# Patient Record
Sex: Female | Born: 1945 | Race: Black or African American | Hispanic: No | Marital: Single | Smoking: Never smoker
Health system: Southern US, Community
[De-identification: ages and names within clinical notes are randomized; demographics above are authoritative.]

## PROBLEM LIST (undated history)

## (undated) DIAGNOSIS — R42 Dizziness and giddiness: Secondary | ICD-10-CM

## (undated) DIAGNOSIS — E119 Type 2 diabetes mellitus without complications: Secondary | ICD-10-CM

## (undated) DIAGNOSIS — K5792 Diverticulitis of intestine, part unspecified, without perforation or abscess without bleeding: Secondary | ICD-10-CM

## (undated) DIAGNOSIS — M109 Gout, unspecified: Secondary | ICD-10-CM

## (undated) DIAGNOSIS — M1990 Unspecified osteoarthritis, unspecified site: Secondary | ICD-10-CM

## (undated) DIAGNOSIS — E7800 Pure hypercholesterolemia, unspecified: Secondary | ICD-10-CM

## (undated) DIAGNOSIS — I10 Essential (primary) hypertension: Secondary | ICD-10-CM

## (undated) DIAGNOSIS — H02402 Unspecified ptosis of left eyelid: Secondary | ICD-10-CM

## (undated) DIAGNOSIS — R270 Ataxia, unspecified: Secondary | ICD-10-CM

## (undated) HISTORY — DX: Gout, unspecified: M10.9

## (undated) HISTORY — DX: Unspecified ptosis of left eyelid: H02.402

## (undated) HISTORY — DX: Dizziness and giddiness: R42

## (undated) HISTORY — DX: Diverticulitis of intestine, part unspecified, without perforation or abscess without bleeding: K57.92

## (undated) HISTORY — DX: Type 2 diabetes mellitus without complications: E11.9

## (undated) HISTORY — DX: Ataxia, unspecified: R27.0

## (undated) HISTORY — DX: Essential (primary) hypertension: I10

## (undated) HISTORY — DX: Unspecified osteoarthritis, unspecified site: M19.90

## (undated) HISTORY — DX: Pure hypercholesterolemia, unspecified: E78.00

---

## 1998-07-13 DIAGNOSIS — I671 Cerebral aneurysm, nonruptured: Secondary | ICD-10-CM

## 1998-07-13 HISTORY — DX: Cerebral aneurysm, nonruptured: I67.1

## 1998-11-28 HISTORY — PX: CEREBRAL ANEURYSM REPAIR: SHX164

## 2019-12-25 ENCOUNTER — Encounter: Payer: Self-pay | Admitting: *Deleted

## 2019-12-25 ENCOUNTER — Other Ambulatory Visit: Payer: Self-pay | Admitting: *Deleted

## 2019-12-26 ENCOUNTER — Encounter: Payer: Self-pay | Admitting: Diagnostic Neuroimaging

## 2019-12-26 ENCOUNTER — Ambulatory Visit: Payer: Federal, State, Local not specified - PPO | Admitting: Diagnostic Neuroimaging

## 2019-12-26 VITALS — BP 146/88 | HR 72 | Ht 64.0 in | Wt 188.0 lb

## 2019-12-26 DIAGNOSIS — R42 Dizziness and giddiness: Secondary | ICD-10-CM | POA: Diagnosis not present

## 2019-12-26 DIAGNOSIS — G459 Transient cerebral ischemic attack, unspecified: Secondary | ICD-10-CM

## 2019-12-26 DIAGNOSIS — R269 Unspecified abnormalities of gait and mobility: Secondary | ICD-10-CM

## 2019-12-26 NOTE — Progress Notes (Signed)
[facility]  PATIENT: Deborah Randolph DOB: 1945/10/17  REFERRING CLINICIAN: Glenda Chroman, MD HISTORY FROM: patient and sister  REASON FOR VISIT: new consult    HISTORICAL  CHIEF COMPLAINT:  Chief Complaint  Patient presents with  . Ataxia    rm 6 New Pt, sister- Belenda Cruise, "hx of brain aneurysm with repair 2000; I sometimes fall forward but catch my self"     HISTORY OF PRESENT ILLNESS:   74 year old female here for evaluation of gait and balance difficulty and dizziness.  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  For past 1 to 2 years patient has had gradual onset progressive gait and balance difficulty.  1 year ago she started using a cane for balance.  At times she feels like she is veering off the left side.  Symptoms worse in the last 1 to 2 months.  She denies any focal weakness.  Symptoms fluctuate and are intermittent.  Sometimes she drags her right leg.  She has noticed ongoing fluctuating left ptosis since her aneurysm in 2000.  Also had an attack of "dizziness" on 12/10/2019 when she was sitting down.  She had been missing her some of her medications for blood pressure and diabetes.  Patient went to emergency room for evaluation was noted to have significant hypertension.  Blood pressure was treated.  Symptoms gradually improved over 6 to 12 hours.    REVIEW OF SYSTEMS: Full 14 system review of systems performed and negative with exception of: As per HPI.  ALLERGIES: Allergies  Allergen Reactions  . Crestor [Rosuvastatin] Other (See Comments)    Achy musculoskeletal  . Other     Shell fish cause gout  . Simvastatin     Musculoskeletal aching    HOME MEDICATIONS: Outpatient Medications Prior to Visit  Medication Sig Dispense Refill  . amLODipine (NORVASC) 5 MG tablet Take 5 mg by mouth daily.    Marland Kitchen aspirin 81 MG  chewable tablet Chew by mouth daily.    Marland Kitchen atenolol (TENORMIN) 50 MG tablet Take 50 mg by mouth daily.    . Cholecalciferol (VITAMIN D3 PO) Take by mouth. Dose unknown    . glipiZIDE-metformin (METAGLIP) 5-500 MG tablet TAKE 2 TABLETS BY MOUTH EVERY MORNING AND 1 TAB EVERY EVENING    . losartan (COZAAR) 50 MG tablet Take 50 mg by mouth daily.    . Potassium Citrate 15 MEQ (1620 MG) TBCR Take 1 tablet by mouth 2 (two) times daily.    . pravastatin (PRAVACHOL) 10 MG tablet Take 10 mg by mouth daily.     No facility-administered medications prior to visit.    PAST MEDICAL HISTORY: Past Medical History:  Diagnosis Date  . Ataxia   . Brain aneurysm 2000  . Diabetes ([place])    type 2  . Diverticulitis   . Dizziness   . Gout   . Hypercholesterolemia   . Hypertension   . Osteoarthritis   . Ptosis of left eyelid     PAST SURGICAL HISTORY: Past Surgical History:  Procedure Laterality Date  . CEREBRAL ANEURYSM REPAIR  11/28/1998    FAMILY HISTORY: Family History  Problem Relation Age of Onset  . Alzheimer's disease Mother   . Heart attack Father     SOCIAL HISTORY: Social History   Socioeconomic History  . Marital status: Single    Spouse name: Not on file  . Number  of children: 0  . Years of education: 79  . 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 Smoking status: Never Smoker  . Smokeless tobacco: Never Used  Substance and Sexual Activity  . Alcohol use: Never  . Drug use: Never  . Sexual activity: Not on file  Other Topics Concern  . Not on file  Social History Narrative   Lives with family   No caffeine   Social Determinants of Health   Financial Resource Strain:   . Difficulty of Paying Living Expenses:   Food Insecurity:   . Worried About Programme researcher, broadcasting/film/video in the Last Year:   . Barista in the Last Year:   Transportation Needs:   . Freight forwarder (Medical):   Marland Kitchen  Lack of Transportation (Non-Medical):   Physical Activity:   . Days of Exercise per Week:   . Minutes of Exercise per Session:   Stress:   . Feeling of Stress :   Social Connections:   . Frequency of Communication with Friends and Family:   . Frequency of Social Gatherings with Friends and Family:   . Attends Religious Services:   . Active Member of Clubs or Organizations:   . Attends Banker Meetings:   Marland Kitchen Marital Status:   Intimate Partner Violence:   . Fear of Current or Ex-Partner:   . Emotionally Abused:   Marland Kitchen Physically Abused:   . Sexually Abused:      PHYSICAL EXAM  GENERAL EXAM/CONSTITUTIONAL: Vitals:  Vitals:   12/26/19 1313  BP: (!) 146/88  Pulse: 72  Weight: 188 lb (85.3 kg)  Height: 5\' 4"  (1.626 m)     Body mass index is 32.27 kg/m. Wt Readings from Last 3 Encounters:  12/26/19 188 lb (85.3 kg)     Patient is in no distress; well developed, nourished and groomed; neck is supple  CARDIOVASCULAR:  Examination of carotid arteries is normal; no carotid bruits  Regular rate and rhythm, no murmurs  Examination of peripheral vascular system by observation and palpation is normal  EYES:  Ophthalmoscopic exam of optic discs and posterior segments is normal; no papilledema or hemorrhages  No exam data present  MUSCULOSKELETAL:  Gait, strength, tone, movements noted in Neurologic exam below  NEUROLOGIC: MENTAL STATUS:  No flowsheet data found.  awake, alert, oriented to person, place and time  recent and remote memory intact  normal attention and concentration  DECR FLUENCY AND COMPREHENSION  fund of knowledge appropriate  CRANIAL NERVE:   2nd - no papilledema on fundoscopic exam  2nd, 3rd, 4th, 6th - pupils equal and reactive to light, visual fields full to confrontation, extraocular muscles intact, no nystagmus; LEFT PTOSIS  5th - facial sensation symmetric  7th - facial strength symmetric  8th - hearing intact  9th  - palate elevates symmetrically, uvula midline  11th - shoulder shrug symmetric  12th - tongue protrusion midline  MOTOR:   normal bulk and tone, full strength in the BUE, BLE  SLOW ON RIGHT SIDE  SENSORY:   normal and symmetric to light touch, temperature, vibration  COORDINATION:   finger-nose-finger, fine finger movements normal  REFLEXES:   deep tendon reflexes trace and symmetric; ABSENT IN ANKLES  GAIT/STATION:   narrow based gait; SLOW AND CAREFUL; USES A CANE     DIAGNOSTIC DATA (LABS, IMAGING, TESTING) - I reviewed patient records, labs, notes,  testing and imaging myself where available.  No results found for: WBC, HGB, HCT, MCV, PLT No results found for: NA, K, CL, CO2, GLUCOSE, BUN, CREATININE, CALCIUM, PROT, ALBUMIN, AST, ALT, ALKPHOS, BILITOT, GFRNONAA, GFRAA No results found for: CHOL, HDL, LDLCALC, LDLDIRECT, TRIG, CHOLHDL No results found for: EBRA3E No results found for: VITAMINB12 No results found for: TSH   12/10/19 CT head  1. No evidence of acute intracranial abnormality.  2. LEFT craniotomy changes with aneurysm clip in the LEFT MCA region  and LEFT temporal/basal ganglia encephalomalacia.  3. Mild chronic small-vessel white matter ischemic changes.      ASSESSMENT AND PLAN  74 y.o. year old female here with:  Dx:  1. TIA (transient ischemic attack)   2. Dizziness   3. Gait difficulty     PLAN:  DIZZINESS ATTACK WHILE SITTING (ER visit 12/10/19 with significant hypertension) - resolved after few minutes and BP treatment in ER - check CTA head / neck (TIA eval); cannot check MRI of brain due to unknown surgical details of aneurysm clipping from 2000 - check TTE (TIA eval) - continue BP and DM treatments  GAIT DIFFICULTY (since 2020; progressive) - related to diabetic neuropathy, lumbar radiculopathy, prior left MCA stroke / aneurysm) - continue use of cane - consider PT evaluation   Orders Placed This Encounter  Procedures   . CT ANGIO HEAD W OR WO CONTRAST  . CT ANGIO NECK W OR WO CONTRAST  . ECHOCARDIOGRAM COMPLETE BUBBLE STUDY   Return for pending if symptoms worsen or fail to improve, return to PCP.    Suanne Marker, MD 12/26/2019, 1:50 PM Certified in Neurology, Neurophysiology and Neuroimaging  [facility] [address], [address] [place], [place] [address] [phone]

## 2019-12-26 NOTE — Patient Instructions (Signed)
DIZZINESS ATTACK WHILE SITTING (ER visit 12/10/19 with significant hypertension) - resolved after few minutes and BP treatment in ER - check CTA head / neck (TIA eval) - check TTE (TIA eval) - continue BP and DM treatments  GAIT DIFFICULTY (since 2020; progressive) - related to diabetic neuropathy, lumbar radiculopathy, prior left MCA stroke / aneurysm - continue use of cane - consider PT evaluation

## 2020-01-02 ENCOUNTER — Telehealth: Payer: Self-pay | Admitting: Diagnostic Neuroimaging

## 2020-01-02 DIAGNOSIS — G459 Transient cerebral ischemic attack, unspecified: Secondary | ICD-10-CM

## 2020-01-02 NOTE — Telephone Encounter (Signed)
Patient is scheduled at [facility] for Friday 01/05/20 to arrive at 3:45 PM patient is aware I also gave her their number of [phone] to r/s. Also aware liquids only 4 hours prior to the exam.   Also she needs lab orders put it for her to get a BUN & creatinine.  [employer] no [place].

## 2020-01-02 NOTE — Addendum Note (Signed)
Addended by: Tamera Stands D on: 01/02/2020 10:17 AM   Modules accepted: Orders

## 2020-01-02 NOTE — Addendum Note (Signed)
Addended by: [place] Pink on: 01/02/2020 10:22 AM   Modules accepted: Orders

## 2020-01-02 NOTE — Telephone Encounter (Addendum)
BUN, creatinine orders placed and released by Alvy Beal, [facility]. Called patient, spoke with sister, Natalia Leatherwood who was with her in office visit. I advised the patient needs to go to [facility] lab before her scan. Natalia Leatherwood verbalized understanding, appreciation.

## 2020-01-05 ENCOUNTER — Other Ambulatory Visit: Payer: Self-pay

## 2020-01-05 ENCOUNTER — Ambulatory Visit (HOSPITAL_COMMUNITY)
Admission: RE | Admit: 2020-01-05 | Discharge: 2020-01-05 | Disposition: A | Discharge status: Home / Self Care | Payer: Federal, State, Local not specified - PPO | Source: Ambulatory Visit | Attending: Diagnostic Neuroimaging | Admitting: Diagnostic Neuroimaging

## 2020-01-05 DIAGNOSIS — G459 Transient cerebral ischemic attack, unspecified: Secondary | ICD-10-CM | POA: Insufficient documentation

## 2020-01-05 DIAGNOSIS — R269 Unspecified abnormalities of gait and mobility: Secondary | ICD-10-CM

## 2020-01-05 DIAGNOSIS — R42 Dizziness and giddiness: Secondary | ICD-10-CM | POA: Diagnosis present

## 2020-01-05 LAB — POCT I-STAT CREATININE: Creatinine, Ser: 0.6 mg/dL (ref 0.44–1.00)

## 2020-01-05 MED ORDER — IOHEXOL 350 MG/ML SOLN: 100.0000 mL | Freq: Once | INTRAVENOUS | Status: DC | PRN

## 2020-01-05 MED ORDER — IOHEXOL 350 MG/ML SOLN
75.0000 mL | Freq: Once | INTRAVENOUS | Status: AC | PRN
  Administered 2020-01-05: 75 mL via INTRAVENOUS

## 2020-01-09 ENCOUNTER — Telehealth: Payer: Self-pay | Admitting: *Deleted

## 2020-01-09 NOTE — Telephone Encounter (Signed)
LVM informing patient that CT angio head results are ok. The MCA clip is stable. Dr Marjory Lies recommends to monitor. Left # for questions.

## 2020-02-21 ENCOUNTER — Telehealth: Payer: Self-pay | Admitting: Diagnostic Neuroimaging

## 2020-02-21 NOTE — Telephone Encounter (Signed)
[employer] auth: [employer] spoke to [place] Ref # [id number]. Patient is scheduled at [facility] for 02/28/20.

## 2020-02-21 NOTE — Telephone Encounter (Signed)
Order Dropped in in [place] rescheduled with [facility] .

## 2020-02-21 NOTE — Telephone Encounter (Signed)
Called patient and left message to call me back about her scheduled echocardiogram on 02/28/2020 please see EPIc if this apt does not work patient can call.

## 2020-02-22 ENCOUNTER — Other Ambulatory Visit (HOSPITAL_COMMUNITY): Payer: Federal, State, Local not specified - PPO

## 2020-02-28 ENCOUNTER — Ambulatory Visit (HOSPITAL_COMMUNITY)
Admission: RE | Admit: 2020-02-28 | Discharge: 2020-02-28 | Disposition: A | Discharge status: Home / Self Care | Payer: Federal, State, Local not specified - PPO | Source: Ambulatory Visit | Attending: Diagnostic Neuroimaging | Admitting: Diagnostic Neuroimaging

## 2020-02-28 ENCOUNTER — Other Ambulatory Visit: Payer: Self-pay

## 2020-02-28 DIAGNOSIS — R42 Dizziness and giddiness: Secondary | ICD-10-CM | POA: Insufficient documentation

## 2020-02-28 DIAGNOSIS — G459 Transient cerebral ischemic attack, unspecified: Secondary | ICD-10-CM | POA: Insufficient documentation

## 2020-02-28 DIAGNOSIS — R269 Unspecified abnormalities of gait and mobility: Secondary | ICD-10-CM | POA: Insufficient documentation

## 2020-02-28 LAB — ECHOCARDIOGRAM COMPLETE BUBBLE STUDY
Area-P 1/2: 2.42 cm2
S' Lateral: 2.53 cm

## 2020-02-28 NOTE — Progress Notes (Signed)
*  PRELIMINARY RESULTS* Echocardiogram 2D Echocardiogram has been performed with saline bubble study.  Stacey Drain 02/28/2020, 2:51 PM

## 2020-02-29 ENCOUNTER — Telehealth: Payer: Self-pay | Admitting: *Deleted

## 2020-02-29 NOTE — Telephone Encounter (Signed)
LVM informing patient her Echocardiogram was an unremarkable study. No major findings. . Continue current plan. Left # for questions.

## 2021-08-08 IMAGING — CT CT ANGIO HEAD
2 of 12 series · 6 of 35 positions shown · IV contrast (Omnipaque or Isovue)
Comparison: None.

CLINICAL DATA: Treated left MCA aneurysm.  Weakness and dizziness.

EXAM:
CT ANGIOGRAPHY HEAD AND NECK
TECHNIQUE: Multidetector CT imaging of the head and neck was performed using
the standard protocol during bolus administration of intravenous
contrast. Multiplanar CT image reconstructions and MIPs were
obtained to evaluate the vascular anatomy. Carotid stenosis
measurements (when applicable) are obtained utilizing NASCET
criteria, using the distal internal carotid diameter as the
denominator.
CONTRAST:  75mL OMNIPAQUE IOHEXOL 350 MG/ML SOLN

[Series 9: cta head & neck · axial · 0.39mm/px · z∈[-202,+8]mm · 4 of 698 slices shown]
[im 140/698  soft-tissue]
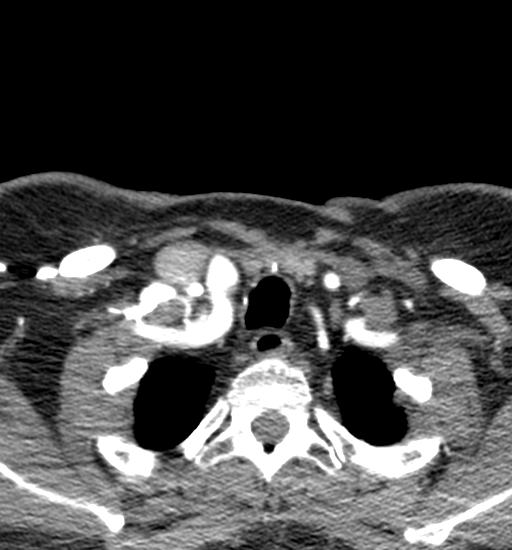
[im 279/698  bone]
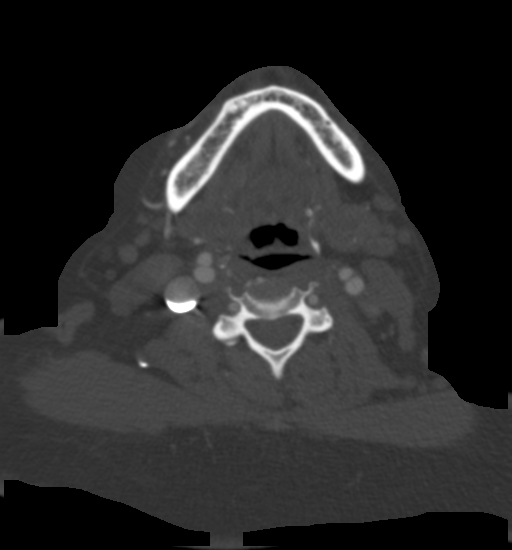
[im 419/698  soft-tissue]
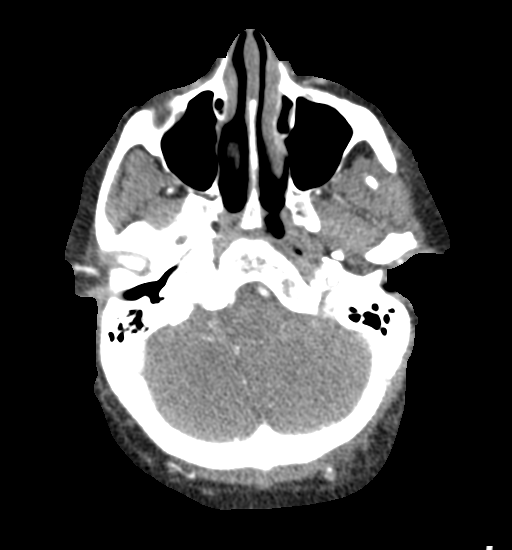
[im 558/698  bone]
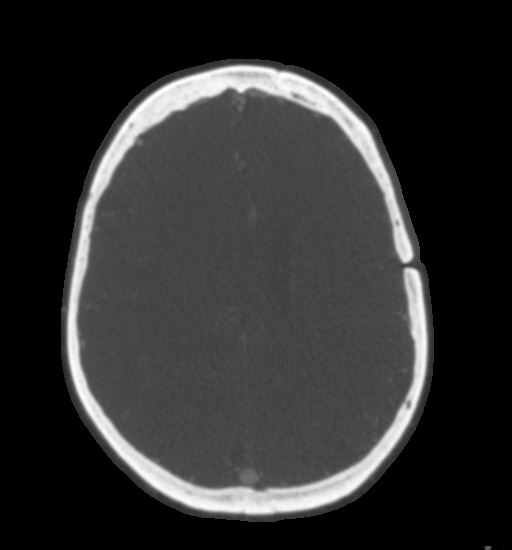

[Series 10: ax thins · axial · 0.39mm/px · z∈[-154,-38]mm · 2 of 349 slices shown]
[im 117/349  soft-tissue]
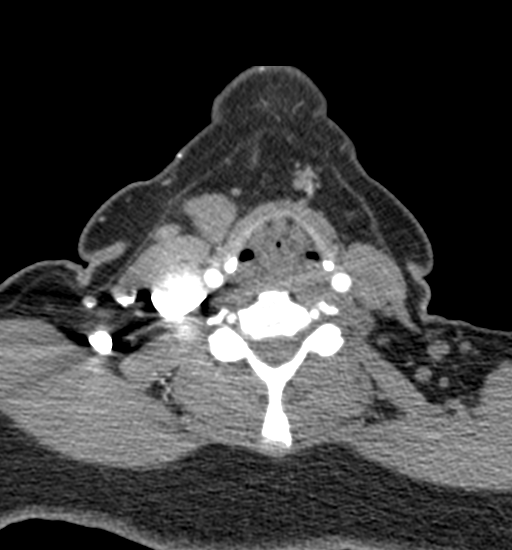
[im 233/349  soft-tissue]
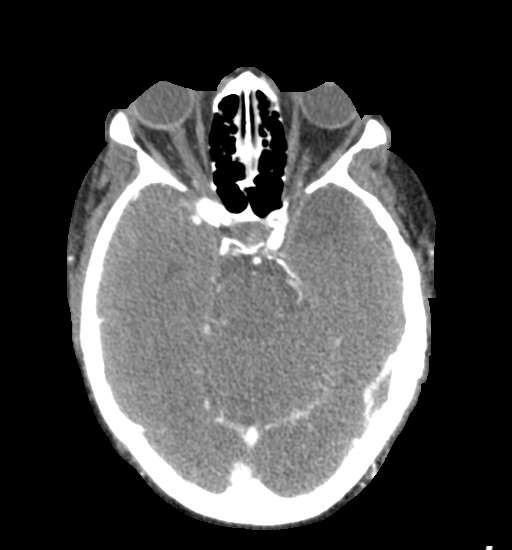

[6 of 35 positions shown; findings below may reference images not displayed]

FINDINGS: CT HEAD FINDINGS

Brain: Area of encephalomalacia in the left basal ganglia. Ex vacuo
dilatation of the left lateral ventricle.

Skull: Remote left frontoparietal craniotomy.

Sinuses/Orbits: No fluid levels or advanced mucosal thickening of
the visualized paranasal sinuses. No mastoid or middle ear effusion.
The orbits are normal.

CTA NECK FINDINGS

SKELETON: There is no bony spinal canal stenosis. No lytic or
blastic lesion.

OTHER NECK: Normal pharynx, larynx and major salivary glands. No
cervical lymphadenopathy. Unremarkable thyroid gland.

UPPER CHEST: No pneumothorax or pleural effusion. No nodules or
masses.

AORTIC ARCH:

There is no calcific atherosclerosis of the aortic arch. There is no
aneurysm, dissection or hemodynamically significant stenosis of the
visualized portion of the aorta. Normal variant aortic arch
branching pattern with the left vertebral artery arising
independently from the aortic arch. The visualized proximal
subclavian arteries are widely patent.

RIGHT CAROTID SYSTEM: Normal without aneurysm, dissection or
stenosis.

LEFT CAROTID SYSTEM: Normal without aneurysm, dissection or
stenosis.

VERTEBRAL ARTERIES: Codominant configuration. Both origins are
clearly patent. There is no dissection, occlusion or flow-limiting
stenosis to the skull base (V1-V3 segments).

CTA HEAD FINDINGS

POSTERIOR CIRCULATION:

--Vertebral arteries: Normal V4 segments.

--Inferior cerebellar arteries: Normal.

--Basilar artery: Normal.

--Superior cerebellar arteries: Normal.

--Posterior cerebral arteries (PCA): Normal.

ANTERIOR CIRCULATION:

--Intracranial internal carotid arteries: Normal.

--Anterior cerebral arteries (ACA): Normal. Both A1 segments are
present. Patent anterior communicating artery (a-comm).

--Middle cerebral arteries (MCA): Aneurysm clip along the course of
the proximal left MCA. No residual filling identified. No MCA
stenosis or occlusion on either side.

VENOUS SINUSES: As permitted by contrast timing, patent.

ANATOMIC VARIANTS: None

Review of the MIP images confirms the above findings.
IMPRESSION: 1. Status post left MCA aneurysm clipping without residual filling
identified.
2. No intracranial or cervical arterial occlusion or high-grade
stenosis.
3. Encephalomalacia in the left basal ganglia.

## 2022-03-31 ENCOUNTER — Other Ambulatory Visit: Payer: Self-pay | Admitting: Internal Medicine

## 2022-03-31 DIAGNOSIS — Z1231 Encounter for screening mammogram for malignant neoplasm of breast: Secondary | ICD-10-CM
